# Patient Record
Sex: Female | Born: 1960 | Race: Black or African American | Hispanic: No | Marital: Married | Smoking: Current every day smoker
Health system: Southern US, Community
[De-identification: ages and names within clinical notes are randomized; demographics above are authoritative.]

## PROBLEM LIST (undated history)

## (undated) DIAGNOSIS — Z8619 Personal history of other infectious and parasitic diseases: Secondary | ICD-10-CM

## (undated) DIAGNOSIS — M8580 Other specified disorders of bone density and structure, unspecified site: Principal | ICD-10-CM

## (undated) HISTORY — PX: NO PAST SURGERIES: SHX2092

## (undated) HISTORY — DX: Other specified disorders of bone density and structure, unspecified site: M85.80

## (undated) HISTORY — DX: Personal history of other infectious and parasitic diseases: Z86.19

---

## 2011-03-30 ENCOUNTER — Encounter: Payer: Self-pay | Admitting: Internal Medicine

## 2011-03-30 ENCOUNTER — Ambulatory Visit (INDEPENDENT_AMBULATORY_CARE_PROVIDER_SITE_OTHER): Payer: BC Managed Care – PPO | Admitting: Internal Medicine

## 2011-03-30 ENCOUNTER — Telehealth: Payer: Self-pay | Admitting: Internal Medicine

## 2011-03-30 VITALS — BP 124/82 | HR 68 | Temp 98.3°F | Resp 18 | Ht 62.0 in | Wt 195.0 lb

## 2011-03-30 DIAGNOSIS — K5900 Constipation, unspecified: Secondary | ICD-10-CM | POA: Insufficient documentation

## 2011-03-30 DIAGNOSIS — Z803 Family history of malignant neoplasm of breast: Secondary | ICD-10-CM

## 2011-03-30 DIAGNOSIS — N926 Irregular menstruation, unspecified: Secondary | ICD-10-CM | POA: Insufficient documentation

## 2011-03-30 DIAGNOSIS — F17200 Nicotine dependence, unspecified, uncomplicated: Secondary | ICD-10-CM

## 2011-03-30 DIAGNOSIS — Z1239 Encounter for other screening for malignant neoplasm of breast: Secondary | ICD-10-CM

## 2011-03-30 MED ORDER — BUPROPION HCL ER (SR) 150 MG PO TB12: 150.0000 mg | ORAL_TABLET | Freq: Two times a day (BID) | ORAL | Status: DC

## 2011-03-30 NOTE — Telephone Encounter (Signed)
Spoke with pharmacist. He stated patient was in there system under another name, However they do have the Rx sent by provider. No action required.

## 2011-03-30 NOTE — Patient Instructions (Signed)
Please try benefiber for your constipation. It is available over the counter. Please schedule cbc, chem7, tsh (menstraul irregularity) and lipid (chol screening) for next Monday fasting Schedule your mammogram (the order has been placed) When you check out please make an appointment (30 min) for a pap smear

## 2011-03-30 NOTE — Progress Notes (Signed)
  Subjective:    Patient ID: Stephanie Barton, female    DOB: [birth year], 50 y.o.   MRN: [id number]  HPI Pt presents to clinic to establish care and for evaluation of menstrual irregularity. Notes intermittent irregularity of periods for ~6months also accompanied by hot flashes and night sweats. No h/o hysterectomy. Tried black cohash without improvement. Sweats and flashes improved with otc macca root. Notes h/o intermittent constipation with bm occuring every other day to every four days. No associated abdominal pain or blood in stool. Family h/o breast cancer in sister and has not undergone mammography before. Last pap 2010 without h/o abn pap. Smoking tobacco <1ppd (one pack lasts ~3d). Interested in cessation and possible medication to help. No other complaints.   Past Medical History  Diagnosis Date  . History of chicken pox     childhood   Past Surgical History  Procedure Date  . No past surgeries 12/26/212    reports that she has been smoking.  She has never used smokeless tobacco. She reports that she does not drink alcohol or use illicit drugs. family history includes Brain cancer in her mother and Breast cancer in her sister. No Known Allergies   Review of Systems  Constitutional: Positive for diaphoresis. Negative for fever.  Gastrointestinal: Positive for constipation. Negative for abdominal pain, diarrhea and blood in stool.  Genitourinary: Positive for menstrual problem.  All other systems reviewed and are negative.       Objective:   Physical Exam  Physical Exam  Nursing note and vitals reviewed. Constitutional: Appears well-developed and well-nourished. No distress.  HENT:  Head: Normocephalic and atraumatic.  Right Ear: External ear normal.  Left Ear: External ear normal.  Eyes: Conjunctivae are normal. No scleral icterus.  Neck: Neck supple. Carotid bruit is not present.  Cardiovascular: Normal rate, regular rhythm and normal heart sounds.  Exam reveals no  gallop and no friction rub.   No murmur heard. Pulmonary/Chest: Effort normal and breath sounds normal. No respiratory distress. He has no wheezes. no rales.  Lymphadenopathy:    He has no cervical adenopathy.  Neurological:Alert.  Skin: Skin is warm and dry. Not diaphoretic.  Psychiatric: Has a normal mood and affect.        Assessment & Plan:

## 2011-03-30 NOTE — Assessment & Plan Note (Signed)
Discussed tobacco cessation, potential complications of continued tobacco use and possible medications to assist cessation. Total time of discussion 4 minutes. Wishes to begin wellbutrin.

## 2011-03-30 NOTE — Assessment & Plan Note (Signed)
Suspect early perimenopause sx's. Discourage estrogen use. Ok to continue otc medications. With sweats and hot flashes obtain cbc, chem7 and tsh.

## 2011-03-30 NOTE — Assessment & Plan Note (Signed)
Obtain tsh. Attempt benefiber. Followup if no improvement or worsening.

## 2011-03-30 NOTE — Assessment & Plan Note (Signed)
Schedule mammogram.

## 2011-04-04 ENCOUNTER — Ambulatory Visit (HOSPITAL_BASED_OUTPATIENT_CLINIC_OR_DEPARTMENT_OTHER)
Admission: RE | Admit: 2011-04-04 | Discharge: 2011-04-04 | Disposition: A | Discharge status: Home / Self Care | Payer: BC Managed Care – PPO | Source: Ambulatory Visit | Attending: Internal Medicine | Admitting: Internal Medicine

## 2011-04-04 DIAGNOSIS — Z1239 Encounter for other screening for malignant neoplasm of breast: Secondary | ICD-10-CM

## 2011-04-04 DIAGNOSIS — Z1231 Encounter for screening mammogram for malignant neoplasm of breast: Secondary | ICD-10-CM

## 2011-04-11 ENCOUNTER — Encounter: Payer: Self-pay | Admitting: Family

## 2011-04-11 ENCOUNTER — Other Ambulatory Visit (HOSPITAL_COMMUNITY)
Admission: RE | Admit: 2011-04-11 | Discharge: 2011-04-11 | Disposition: A | Discharge status: Home / Self Care | Payer: BC Managed Care – PPO | Source: Ambulatory Visit | Attending: Family | Admitting: Family

## 2011-04-11 ENCOUNTER — Ambulatory Visit (INDEPENDENT_AMBULATORY_CARE_PROVIDER_SITE_OTHER): Payer: BC Managed Care – PPO | Admitting: Family

## 2011-04-11 DIAGNOSIS — Z01419 Encounter for gynecological examination (general) (routine) without abnormal findings: Secondary | ICD-10-CM

## 2011-04-11 DIAGNOSIS — F17200 Nicotine dependence, unspecified, uncomplicated: Secondary | ICD-10-CM

## 2011-04-11 DIAGNOSIS — Z803 Family history of malignant neoplasm of breast: Secondary | ICD-10-CM

## 2011-04-11 DIAGNOSIS — R030 Elevated blood-pressure reading, without diagnosis of hypertension: Secondary | ICD-10-CM

## 2011-04-11 DIAGNOSIS — Z23 Encounter for immunization: Secondary | ICD-10-CM

## 2011-04-11 NOTE — Progress Notes (Signed)
Subjective:    Patient ID: Stephanie Barton, female    DOB: 09-14-1960, 51 y.o.   MRN: [id number]  HPI  Tobacco abuse- reports that she feels drowsy on wellbutrin.  She reports She continues to smoke 3-4 cig a day.    GYN- last pap smear was 5 yrs ago.  She reports that she had an abnormal pap >5 yrs with normal follow up pap smear.  She reports that she has had spotting.  She continues hot flashes/night sweats.  She had her mammogram last Monday which was normal.    Review of Systems    see HPI  Past Medical History  Diagnosis Date  . History of chicken pox     childhood    History   Social History  . Marital Status: Married    Spouse Name: N/A    Number of Children: N/A  . Years of Education: N/A   Occupational History  . Not on file.   Social History Main Topics  . Smoking status: Current Everyday Smoker  . Smokeless tobacco: Never Used  . Alcohol Use: No  . Drug Use: No  . Sexually Active: Not on file   Other Topics Concern  . Not on file   Social History Narrative  . No narrative on file    Past Surgical History  Procedure Date  . No past surgeries 12/26/212    Family History  Problem Relation Age of Onset  . Breast cancer Sister     deceased 42  . Brain cancer Mother     brain tumor    No Known Allergies  Current Outpatient Prescriptions on File Prior to Visit  Medication Sig Dispense Refill  . 5-HTP CAPS Take by mouth daily.        Marland Kitchen aspirin EC 325 MG tablet Take 325 mg by mouth daily.        . Cyanocobalamin (B-12) 2500 MCG SUBL Place 2,500 mcg under the tongue daily.        . Maca Root (MACA PO) Take by mouth 3 (three) times daily.          BP 156/90  Pulse 72  Temp(Src) 97.9 F (36.6 C) (Oral)  Resp 16  Ht 5\' 2"  (1.575 m)  Wt 195 lb (88.451 kg)  BMI 35.67 kg/m2  LMP 03/28/2011    Objective:   Physical Exam  Constitutional: She appears well-developed and well-nourished. No distress.  Cardiovascular: Normal rate and regular  rhythm.   No murmur heard. Pulmonary/Chest: Effort normal and breath sounds normal. No respiratory distress. She has no wheezes. She has no rales. She exhibits no tenderness.  Genitourinary:       Breasts: Examined lying.  Right: Without masses, retractions, discharge or axillary adenopathy.  Left: Without masses, retractions, discharge or axillary adenopathy.  Inguinal/mons: Normal without inguinal adenopathy  External genitalia: Normal  BUS/Urethra/Skene's glands: Normal  Bladder: Normal  Vagina: Normal  Cervix: Normal- dark blood noted at cervical opening Uterus: normal in size, shape and contour. Midline and mobile  Adnexa/parametria:  Rt: Without masses or tenderness.  Lt: Without masses or tenderness.  Anus and perineum: Normal Mervin Kung CMA assisted present for examination            Assessment & Plan:   BP Readings from Last 3 Encounters:  04/11/11 156/90  03/30/11 124/82   25 minutes spent with pt today.  >50% of this time was spent counseling pt on smoking cessation, diet, exercise.  Tdap and flu shot  given today.

## 2011-04-11 NOTE — Assessment & Plan Note (Signed)
Mammogram normal 12/12.

## 2011-04-11 NOTE — Progress Notes (Signed)
Addended by: Mervin Kung A on: 04/11/2011 01:33 PM   Modules accepted: Orders

## 2011-04-11 NOTE — Patient Instructions (Signed)
We will mail you your pap smear results.   Please return to the lab fasting one day this week for your blood work.  Work on a low sodium diet, exercise, weight loss.  Follow up with Dr. Rodena Medin in 1 month for a BP recheck.

## 2011-04-11 NOTE — Assessment & Plan Note (Signed)
BP is elevated today- was ok last visit.  Recommended low sodium diet, exercise, weight loss.  Follow up with Dr. Rodena Medin in 1 month for BP check.

## 2011-04-11 NOTE — Assessment & Plan Note (Signed)
She wishes to stop wellbutrin.  Cost is an issue with chantix.  She wishes to try the nicorette gum instead.  We discussed the importance of smoking cessation.

## 2011-04-11 NOTE — Assessment & Plan Note (Signed)
Pap performed today- though may be obscured by menstrual bleeding. We discussed importance of SBE.  Pt is not fasting today, recommended that she return fasting one day this week to complete labs ordered by Dr. Rodena Medin.

## 2011-04-14 ENCOUNTER — Encounter: Payer: Self-pay | Admitting: Family

## 2011-09-05 ENCOUNTER — Encounter: Payer: Self-pay | Admitting: Internal Medicine

## 2011-09-05 ENCOUNTER — Ambulatory Visit (INDEPENDENT_AMBULATORY_CARE_PROVIDER_SITE_OTHER): Payer: BC Managed Care – PPO | Admitting: Internal Medicine

## 2011-09-05 ENCOUNTER — Ambulatory Visit (HOSPITAL_BASED_OUTPATIENT_CLINIC_OR_DEPARTMENT_OTHER)
Admission: RE | Admit: 2011-09-05 | Discharge: 2011-09-05 | Disposition: A | Discharge status: Home / Self Care | Payer: BC Managed Care – PPO | Source: Ambulatory Visit | Attending: Internal Medicine | Admitting: Internal Medicine

## 2011-09-05 VITALS — BP 116/78 | HR 71 | Temp 98.1°F | Resp 16 | Wt 196.0 lb

## 2011-09-05 DIAGNOSIS — M79609 Pain in unspecified limb: Secondary | ICD-10-CM

## 2011-09-05 DIAGNOSIS — M79673 Pain in unspecified foot: Secondary | ICD-10-CM

## 2011-09-05 MED ORDER — DICLOFENAC SODIUM 75 MG PO TBEC: DELAYED_RELEASE_TABLET | ORAL | Status: AC

## 2011-09-11 DIAGNOSIS — M79673 Pain in unspecified foot: Secondary | ICD-10-CM | POA: Insufficient documentation

## 2011-09-11 NOTE — Assessment & Plan Note (Signed)
Attempt voltaren with food and no other nsaid. Obtain plain xray of foot. Work note provided. Followup if no improvement or worsening.

## 2011-09-11 NOTE — Progress Notes (Signed)
  Subjective:    Patient ID: Stephanie Barton, female    DOB: 1960/10/10, 51 y.o.   MRN: [id number]  HPI Pt presents to clinic for evaluation of foot pain. Notes right foot pain along plantar medial aspect. Describes as sharp. No injury. Pain worse on wt bearing. Taking no medication for the problem. No other alleviating or exacerbating factors.   Past Medical History  Diagnosis Date  . History of chicken pox     childhood   Past Surgical History  Procedure Date  . No past surgeries 12/26/212    reports that she has been smoking.  She has never used smokeless tobacco. She reports that she does not drink alcohol or use illicit drugs. family history includes Brain cancer in her mother and Breast cancer in her sister. No Known Allergies   Review of Systems see hpi     Objective:   Physical Exam  Nursing note and vitals reviewed. Constitutional: She appears well-developed and well-nourished. No distress.  HENT:  Head: Normocephalic and atraumatic.  Musculoskeletal:       Right foot: no erythema, warmth or effusion. +mild tender medial plantar. No mass or bony abn. Gait nl.  Neurological: She is alert.  Skin: Skin is warm and dry. She is not diaphoretic.  Psychiatric: She has a normal mood and affect.          Assessment & Plan:

## 2014-08-20 ENCOUNTER — Telehealth: Payer: Self-pay | Admitting: Internal Medicine

## 2014-08-20 NOTE — Telephone Encounter (Signed)
Pre Visit letter sent  °

## 2014-09-11 ENCOUNTER — Telehealth: Payer: Self-pay | Admitting: *Deleted

## 2014-09-11 ENCOUNTER — Encounter: Payer: Self-pay | Admitting: *Deleted

## 2014-09-11 NOTE — Telephone Encounter (Signed)
Unable to reach patient at time of Pre-Visit Call.  Left message for patient to return call when available.    

## 2014-09-11 NOTE — Telephone Encounter (Signed)
Pre-Visit Call completed with patient and chart updated.   Pre-Visit Info documented in Specialty Comments under [employer].    

## 2014-09-11 NOTE — Addendum Note (Signed)
Addended by: Leticia Penna A on: 09/11/2014 01:45 PM   Modules accepted: Medications

## 2014-09-11 NOTE — Addendum Note (Signed)
Addended by: Leticia Penna A on: 09/11/2014 01:46 PM   Modules accepted: Medications

## 2014-09-12 ENCOUNTER — Other Ambulatory Visit (HOSPITAL_COMMUNITY)
Admission: RE | Admit: 2014-09-12 | Discharge: 2014-09-12 | Disposition: A | Discharge status: Home / Self Care | Payer: BLUE CROSS/BLUE SHIELD | Source: Ambulatory Visit | Attending: Family | Admitting: Family

## 2014-09-12 ENCOUNTER — Ambulatory Visit (INDEPENDENT_AMBULATORY_CARE_PROVIDER_SITE_OTHER): Payer: BLUE CROSS/BLUE SHIELD | Admitting: Family

## 2014-09-12 ENCOUNTER — Encounter: Payer: Self-pay | Admitting: Family

## 2014-09-12 VITALS — BP 136/82 | HR 81 | Temp 98.2°F | Resp 16 | Ht 62.0 in | Wt 179.8 lb

## 2014-09-12 DIAGNOSIS — N852 Hypertrophy of uterus: Secondary | ICD-10-CM | POA: Diagnosis not present

## 2014-09-12 DIAGNOSIS — Z720 Tobacco use: Secondary | ICD-10-CM

## 2014-09-12 DIAGNOSIS — N951 Menopausal and female climacteric states: Secondary | ICD-10-CM

## 2014-09-12 DIAGNOSIS — Z01419 Encounter for gynecological examination (general) (routine) without abnormal findings: Secondary | ICD-10-CM | POA: Diagnosis not present

## 2014-09-12 DIAGNOSIS — Z0000 Encounter for general adult medical examination without abnormal findings: Secondary | ICD-10-CM

## 2014-09-12 DIAGNOSIS — F17200 Nicotine dependence, unspecified, uncomplicated: Secondary | ICD-10-CM

## 2014-09-12 DIAGNOSIS — R232 Flushing: Secondary | ICD-10-CM

## 2014-09-12 DIAGNOSIS — Z1151 Encounter for screening for human papillomavirus (HPV): Secondary | ICD-10-CM | POA: Diagnosis present

## 2014-09-12 DIAGNOSIS — E2839 Other primary ovarian failure: Secondary | ICD-10-CM | POA: Diagnosis not present

## 2014-09-12 LAB — URINALYSIS, ROUTINE W REFLEX MICROSCOPIC
Bilirubin Urine: NEGATIVE
Hgb urine dipstick: NEGATIVE
LEUKOCYTES UA: NEGATIVE
Nitrite: NEGATIVE
Specific Gravity, Urine: >=1.03 — AB (ref 1.000–1.030)
Total Protein, Urine: NEGATIVE
UROBILINOGEN UA: 0.2 (ref 0.0–1.0)
Urine Glucose: NEGATIVE
pH: 6 (ref 5.0–8.0)

## 2014-09-12 LAB — CBC WITH DIFFERENTIAL/PLATELET
BASOS ABS: 0 10*3/uL (ref 0.0–0.1)
Basophils Relative: 0.6 % (ref 0.0–3.0)
Eosinophils Absolute: 0.3 10*3/uL (ref 0.0–0.7)
Eosinophils Relative: 3.7 % (ref 0.0–5.0)
HCT: 40.9 % (ref 36.0–46.0)
Hemoglobin: 13.2 g/dL (ref 12.0–15.0)
LYMPHS ABS: 2.8 10*3/uL (ref 0.7–4.0)
Lymphocytes Relative: 37.5 % (ref 12.0–46.0)
MCHC: 32.4 g/dL (ref 30.0–36.0)
MCV: 85.5 fl (ref 78.0–100.0)
Monocytes Absolute: 0.4 10*3/uL (ref 0.1–1.0)
Monocytes Relative: 5 % (ref 3.0–12.0)
NEUTROS ABS: 3.9 10*3/uL (ref 1.4–7.7)
Neutrophils Relative %: 53.2 % (ref 43.0–77.0)
Platelets: 167 10*3/uL (ref 150.0–400.0)
RBC: 4.78 Mil/uL (ref 3.87–5.11)
RDW: 13.8 % (ref 11.5–15.5)
WBC: 7.4 10*3/uL (ref 4.0–10.5)

## 2014-09-12 LAB — BASIC METABOLIC PANEL
BUN: 16 mg/dL (ref 6–23)
CHLORIDE: 106 meq/L (ref 96–112)
CO2: 28 mEq/L (ref 19–32)
Calcium: 9.8 mg/dL (ref 8.4–10.5)
Creatinine, Ser: 0.68 mg/dL (ref 0.40–1.20)
GFR: 116.1 mL/min (ref >60.00)
Glucose, Bld: 88 mg/dL (ref 70–99)
Potassium: 4.1 mEq/L (ref 3.5–5.1)
SODIUM: 138 meq/L (ref 135–145)

## 2014-09-12 LAB — HEPATIC FUNCTION PANEL
ALK PHOS: 89 U/L (ref 39–117)
ALT: 11 U/L (ref 0–35)
AST: 14 U/L (ref 0–37)
Albumin: 4.1 g/dL (ref 3.5–5.2)
BILIRUBIN TOTAL: 0.3 mg/dL (ref 0.2–1.2)
Bilirubin, Direct: 0 mg/dL (ref 0.0–0.3)
Total Protein: 6.8 g/dL (ref 6.0–8.3)

## 2014-09-12 LAB — LIPID PANEL
CHOL/HDL RATIO: 3
Cholesterol: 160 mg/dL (ref 0–200)
HDL: 57.7 mg/dL (ref >39.00)
LDL CALC: 82 mg/dL (ref 0–99)
NonHDL: 102.3
TRIGLYCERIDES: 104 mg/dL (ref 0.0–149.0)
VLDL: 20.8 mg/dL (ref 0.0–40.0)

## 2014-09-12 LAB — TSH: TSH: 0.48 u[IU]/mL (ref 0.35–4.50)

## 2014-09-12 MED ORDER — VARENICLINE TARTRATE 0.5 MG X 11 & 1 MG X 42 PO MISC: ORAL | Status: DC

## 2014-09-12 MED ORDER — CITALOPRAM HYDROBROMIDE 10 MG PO TABS: 10.0000 mg | ORAL_TABLET | Freq: Every day | ORAL | Status: DC

## 2014-09-12 NOTE — Assessment & Plan Note (Signed)
Will refer for pelvic ultrasound to further evaluate.

## 2014-09-12 NOTE — Progress Notes (Signed)
Pre visit review using our clinic review tool, if applicable. No additional management support is needed unless otherwise documented below in the visit note. 

## 2014-09-12 NOTE — Addendum Note (Signed)
Addended by: Kelle Darting A on: 09/12/2014 03:24 PM   Modules accepted: Orders

## 2014-09-12 NOTE — Patient Instructions (Addendum)
Start citalopram for hot flashes. Complete lab work prior to leaving. You will be contacted about your referral to GI for colonoscopy  Please let us know if you have not heard back within 1 week about your referral. Schedule a routine eye exam. Continue to work on healthy diet, exercise, weight loss. Start chantix to help you quit smoking. Call before the first month runs out to let us know how you are doing and we can send your refills for months 2 and 3.  Follow up in 6 weeks.

## 2014-09-12 NOTE — Assessment & Plan Note (Signed)
Not well controlled. Will give trial of citalopram.

## 2014-09-12 NOTE — Assessment & Plan Note (Signed)
Trial of chantix. Common side effects including rare risk of suicide ideation was discussed with the patient today.  Patient is instructed to go directly to the ED if this occurs.  We discussed that patient can continue to smoke for 1 week after starting chantix, but then must discontinue cigarettes.  He is also instructed to contact us prior to completion of the starter month pack for an rx for the continuation month pack.  5 minutes spent with patient today on tobacco cessation counseling.   

## 2014-09-12 NOTE — Assessment & Plan Note (Signed)
Discussed healthy diet, exercise and weight loss. Refer for mammo, colo, dexa. Obtain routine lab work.

## 2014-09-12 NOTE — Progress Notes (Addendum)
Subjective:    Patient ID: Stephanie Barton, female    DOB: 1960/10/09, 54 y.o.   MRN: [id number]  HPI  Stephanie Barton is a 54 yr old female who presents today for cpx.  Patient presents today for complete physical.  Immunizations: up to date Diet: reports diet low in fruits/veggies Exercise: some exercise Colonoscopy: due Dexa: due Pap Smear: pap smear, 2013 Mammogram: due Dental:  Up to date Vision- due Tobacco abuse- 1/3 PPD.   Wt Readings from Last 3 Encounters:  09/12/14 179 lb 12.8 oz (81.557 kg)  09/05/11 196 lb (88.905 kg)  04/11/11 195 lb (88.451 kg)      Review of Systems  Constitutional: Negative for unexpected weight change.  HENT: Negative for hearing loss and rhinorrhea.   Eyes:       Occasional blurring, has not had eyes examined  Respiratory: Negative for cough.   Cardiovascular: Negative for chest pain and leg swelling.  Gastrointestinal: Negative for nausea and diarrhea.       Occasional constipation  Genitourinary: Negative for dysuria and frequency.  Musculoskeletal: Negative for myalgias and arthralgias.  Skin: Negative for rash.  Neurological: Negative for headaches.  Hematological: Negative for adenopathy.  Psychiatric/Behavioral: Negative for dysphoric mood and agitation.   Past Medical History  Diagnosis Date  . History of chicken pox     childhood    History   Social History  . Marital Status: Married    Spouse Name: N/A  . Number of Children: N/A  . Years of Education: N/A   Occupational History  . Not on file.   Social History Main Topics  . Smoking status: Current Every Day Smoker  . Smokeless tobacco: Never Used  . Alcohol Use: No  . Drug Use: No  . Sexual Activity: Not on file   Other Topics Concern  . Not on file   Social History Narrative    Past Surgical History  Procedure Laterality Date  . No past surgeries  12/26/212    Family History  Problem Relation Age of Onset  . Breast cancer Sister     deceased  36  . Brain cancer Mother     brain tumor    No Known Allergies  Current Outpatient Prescriptions on File Prior to Visit  Medication Sig Dispense Refill  . aspirin EC 325 MG tablet Take 650 mg by mouth daily.     Marland Kitchen BLACK COHOSH PO Take 2 tablets by mouth daily.     Marland Kitchen CINNAMON PO Take 1 tablet by mouth daily.     No current facility-administered medications on file prior to visit.    BP 136/82 mmHg  Pulse 81  Temp(Src) 98.2 F (36.8 C) (Oral)  Resp 16  Ht 5\' 2"  (1.575 m)  Wt 179 lb 12.8 oz (81.557 kg)  BMI 32.88 kg/m2  SpO2 98%  LMP 03/28/2011       Objective:   Physical Exam   Physical Exam  Constitutional: She is oriented to person, place, and time. She appears well-developed and well-nourished. No distress.  HENT:  Head: Normocephalic and atraumatic.  Right Ear: Tympanic membrane and ear canal normal.  Left Ear: Tympanic membrane and ear canal normal.  Mouth/Throat: Oropharynx is clear and moist.  Eyes: Pupils are equal, round, and reactive to light. No scleral icterus.  Neck: Normal range of motion. No thyromegaly present.  Cardiovascular: Normal rate and regular rhythm.   No murmur heard. Pulmonary/Chest: Effort normal and breath sounds normal. No respiratory distress.  He has no wheezes. She has no rales. She exhibits no tenderness.  Abdominal: Soft. Bowel sounds are normal. He exhibits no distension and no mass. There is no tenderness. There is no rebound and no guarding.  Musculoskeletal: She exhibits no edema.  Lymphadenopathy:    She has no cervical adenopathy.  Neurological: She is alert and oriented to person, place, and time. She has 1+ bilateral patellar reflexes. She exhibits normal muscle tone. Coordination normal.  Skin: Skin is warm and dry.  Psychiatric: She has a normal mood and affect. Her behavior is normal. Judgment and thought content normal.  Breasts: Examined lying Right: Without masses, retractions, discharge or axillary adenopathy.    Left: Without masses, retractions, discharge or axillary adenopathy.  Inguinal/mons: Normal without inguinal adenopathy  External genitalia: Normal  BUS/Urethra/Skene's glands: Normal  Bladder: Normal  Vagina: Normal  Cervix: Normal  Uterus: some uterine fullness is noted Midline and mobile  Adnexa/parametria:  Rt: Without masses or tenderness.  Lt: Without masses or tenderness.  Anus and perineum: Normal           Assessment & Plan:        Assessment & Plan:  I have personally reviewed and interpreted EKG- NSR without ischemic changes noted.

## 2014-09-12 NOTE — Addendum Note (Signed)
Addended by: Debbrah Alar on: 09/12/2014 01:16 PM   Modules accepted: Miquel Dunn

## 2014-09-16 LAB — CYTOLOGY - PAP

## 2014-09-17 ENCOUNTER — Encounter: Payer: Self-pay | Admitting: General Practice

## 2014-09-19 ENCOUNTER — Ambulatory Visit (HOSPITAL_BASED_OUTPATIENT_CLINIC_OR_DEPARTMENT_OTHER)
Admission: RE | Admit: 2014-09-19 | Discharge: 2014-09-19 | Disposition: A | Discharge status: Home / Self Care | Payer: BLUE CROSS/BLUE SHIELD | Source: Ambulatory Visit | Attending: Family | Admitting: Family

## 2014-09-19 DIAGNOSIS — N852 Hypertrophy of uterus: Secondary | ICD-10-CM

## 2014-09-19 DIAGNOSIS — M8580 Other specified disorders of bone density and structure, unspecified site: Secondary | ICD-10-CM | POA: Diagnosis not present

## 2014-09-19 DIAGNOSIS — E2839 Other primary ovarian failure: Secondary | ICD-10-CM | POA: Diagnosis present

## 2014-09-19 DIAGNOSIS — Z0000 Encounter for general adult medical examination without abnormal findings: Secondary | ICD-10-CM

## 2014-09-25 ENCOUNTER — Telehealth: Payer: Self-pay | Admitting: Family

## 2014-09-25 ENCOUNTER — Encounter: Payer: Self-pay | Admitting: Family

## 2014-09-25 DIAGNOSIS — M8580 Other specified disorders of bone density and structure, unspecified site: Secondary | ICD-10-CM | POA: Insufficient documentation

## 2014-09-25 HISTORY — DX: Other specified disorders of bone density and structure, unspecified site: M85.80

## 2014-09-25 NOTE — Telephone Encounter (Signed)
See letter 09/25/14.

## 2014-09-26 ENCOUNTER — Ambulatory Visit (HOSPITAL_BASED_OUTPATIENT_CLINIC_OR_DEPARTMENT_OTHER)
Admission: RE | Admit: 2014-09-26 | Discharge: 2014-09-26 | Disposition: A | Discharge status: Home / Self Care | Payer: BLUE CROSS/BLUE SHIELD | Source: Ambulatory Visit | Attending: Family | Admitting: Family

## 2014-09-26 DIAGNOSIS — D259 Leiomyoma of uterus, unspecified: Secondary | ICD-10-CM | POA: Insufficient documentation

## 2014-09-26 DIAGNOSIS — N852 Hypertrophy of uterus: Secondary | ICD-10-CM | POA: Diagnosis present

## 2014-09-28 ENCOUNTER — Telehealth: Payer: Self-pay | Admitting: Family

## 2014-09-28 DIAGNOSIS — R9389 Abnormal findings on diagnostic imaging of other specified body structures: Secondary | ICD-10-CM

## 2014-09-28 NOTE — Telephone Encounter (Signed)
Please advise pt that US shows fibroid as expected.  Note is made of irregularity left pelvis, likely fibroid.  I would like her to see GYN for further evaluation please.

## 2014-09-29 ENCOUNTER — Encounter: Payer: Self-pay | Admitting: Family

## 2014-09-29 NOTE — Telephone Encounter (Signed)
Advised patient per recommendations.  Referral entered

## 2014-10-24 ENCOUNTER — Ambulatory Visit: Payer: BLUE CROSS/BLUE SHIELD | Admitting: Family

## 2014-11-03 ENCOUNTER — Ambulatory Visit (INDEPENDENT_AMBULATORY_CARE_PROVIDER_SITE_OTHER): Payer: BLUE CROSS/BLUE SHIELD | Admitting: Family

## 2014-11-03 ENCOUNTER — Encounter: Payer: Self-pay | Admitting: Family

## 2014-11-03 VITALS — BP 126/94 | HR 83 | Temp 98.1°F | Resp 16 | Ht 62.0 in | Wt 185.6 lb

## 2014-11-03 DIAGNOSIS — N951 Menopausal and female climacteric states: Secondary | ICD-10-CM | POA: Diagnosis not present

## 2014-11-03 DIAGNOSIS — F17200 Nicotine dependence, unspecified, uncomplicated: Secondary | ICD-10-CM

## 2014-11-03 DIAGNOSIS — Z720 Tobacco use: Secondary | ICD-10-CM

## 2014-11-03 DIAGNOSIS — R232 Flushing: Secondary | ICD-10-CM

## 2014-11-03 MED ORDER — BUPROPION HCL ER (SR) 150 MG PO TB12: ORAL_TABLET | ORAL | Status: AC

## 2014-11-03 NOTE — Patient Instructions (Signed)
Start Wellbutrin once daily for 3 days then increase to twice daily on day four. Follow up in 6 weeks.

## 2014-11-03 NOTE — Progress Notes (Signed)
   Subjective:    Patient ID: Stephanie Barton, female    DOB: October 19, 1960, 54 y.o.   MRN: [id number]  HPI  [name] is a 54 yr old female who presents today for follow up.  1) Hot flashes- last visit pt was given rx for citalopram. Reports hot flashes are improved. Took citalopram x 1 week the stopped because she "felt funny."   2) Tobacco abuse- could not afford chantix. Wants to quit.  Currently smoking 4 cigarettes a day.    Review of Systems See HPI  Past Medical History  Diagnosis Date  . History of chicken pox     childhood  . Osteopenia 09/25/2014    History   Social History  . Marital Status: Married    Spouse Name: N/A  . Number of Children: N/A  . Years of Education: N/A   Occupational History  . Not on file.   Social History Main Topics  . Smoking status: Current Every Day Smoker  . Smokeless tobacco: Never Used  . Alcohol Use: No  . Drug Use: No  . Sexual Activity: Not on file   Other Topics Concern  . Not on file   Social History Narrative   Works at [employer]   Married   No children   Enjoys watching television   completed 10th grade   Raised in [place], moved to [place] when she was 61 when her mother passed       Past Surgical History  Procedure Laterality Date  . No past surgeries  12/26/212    Family History  Problem Relation Age of Onset  . Breast cancer Sister     deceased 69  . Brain cancer Mother     brain tumor    No Known Allergies  Current Outpatient Prescriptions on File Prior to Visit  Medication Sig Dispense Refill  . aspirin EC 325 MG tablet Take 650 mg by mouth daily.     Marland Kitchen BLACK COHOSH PO Take 2 tablets by mouth daily.     Marland Kitchen CINNAMON PO Take 1 tablet by mouth daily.    . Omega-3 Fatty Acids (FISH OIL) 1000 MG CAPS Take 1 capsule by mouth daily.     No current facility-administered medications on file prior to visit.    BP 126/94 mmHg  Pulse 83  Temp(Src) 98.1 F (36.7 C) (Oral)  Resp 16  Ht 5\' 2"  (1.575  m)  Wt 185 lb 9.6 oz (84.188 kg)  BMI 33.94 kg/m2  SpO2 100%  LMP 03/28/2011       Objective:   Physical Exam  Constitutional: She is oriented to person, place, and time. She appears well-developed and well-nourished.  HENT:  Head: Normocephalic and atraumatic.  Cardiovascular: Normal rate, regular rhythm and normal heart sounds.   No murmur heard. Pulmonary/Chest: Effort normal and breath sounds normal. No respiratory distress. She has no wheezes.  Neurological: She is alert and oriented to person, place, and time.  Psychiatric: She has a normal mood and affect. Her behavior is normal. Judgment and thought content normal.          Assessment & Plan:

## 2014-11-03 NOTE — Assessment & Plan Note (Signed)
Stable off of meds.  Monitor.  

## 2014-11-03 NOTE — Assessment & Plan Note (Signed)
Could not afford chantix. Start wellbutrin.

## 2014-11-03 NOTE — Progress Notes (Signed)
Pre visit review using our clinic review tool, if applicable. No additional management support is needed unless otherwise documented below in the visit note. 

## 2014-11-27 ENCOUNTER — Encounter: Payer: BLUE CROSS/BLUE SHIELD | Admitting: Gastroenterology

## 2016-02-16 IMAGING — MG MM DIGITAL SCREENING BILAT W/ CAD
5 series · 5 of 5 positions shown · non-contrast
Comparison: Previous exam(s).

CLINICAL DATA: Screening.

EXAM:
DIGITAL SCREENING BILATERAL MAMMOGRAM WITH CAD

[R CC]
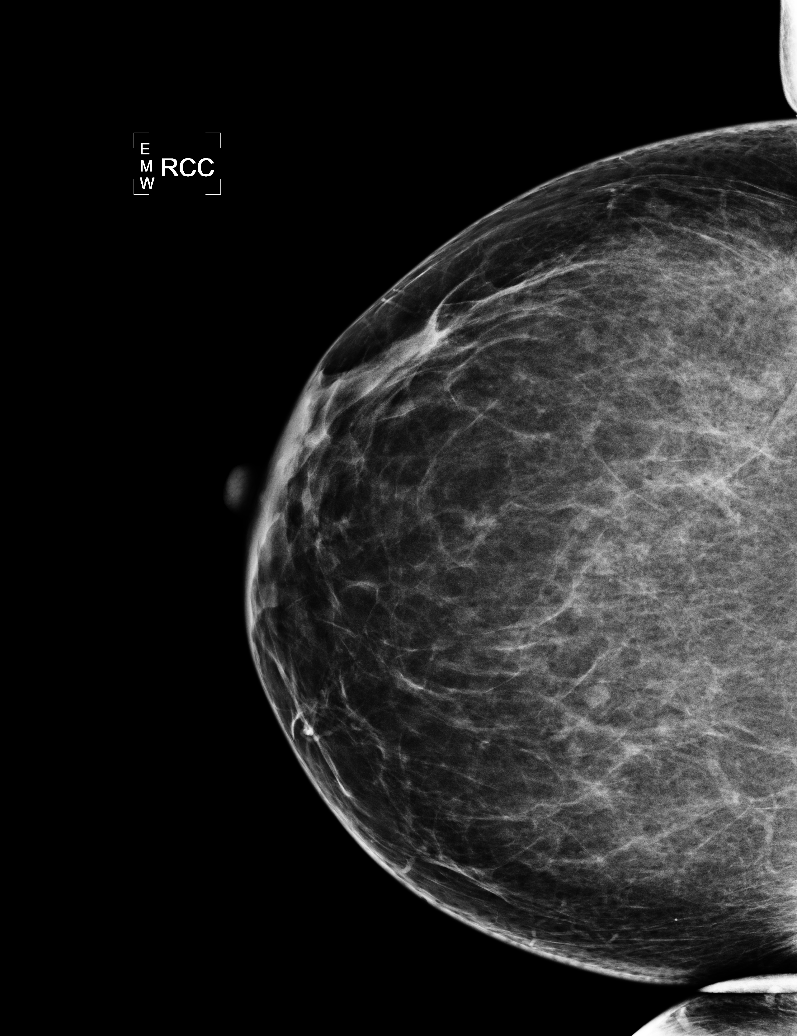

[L CC]
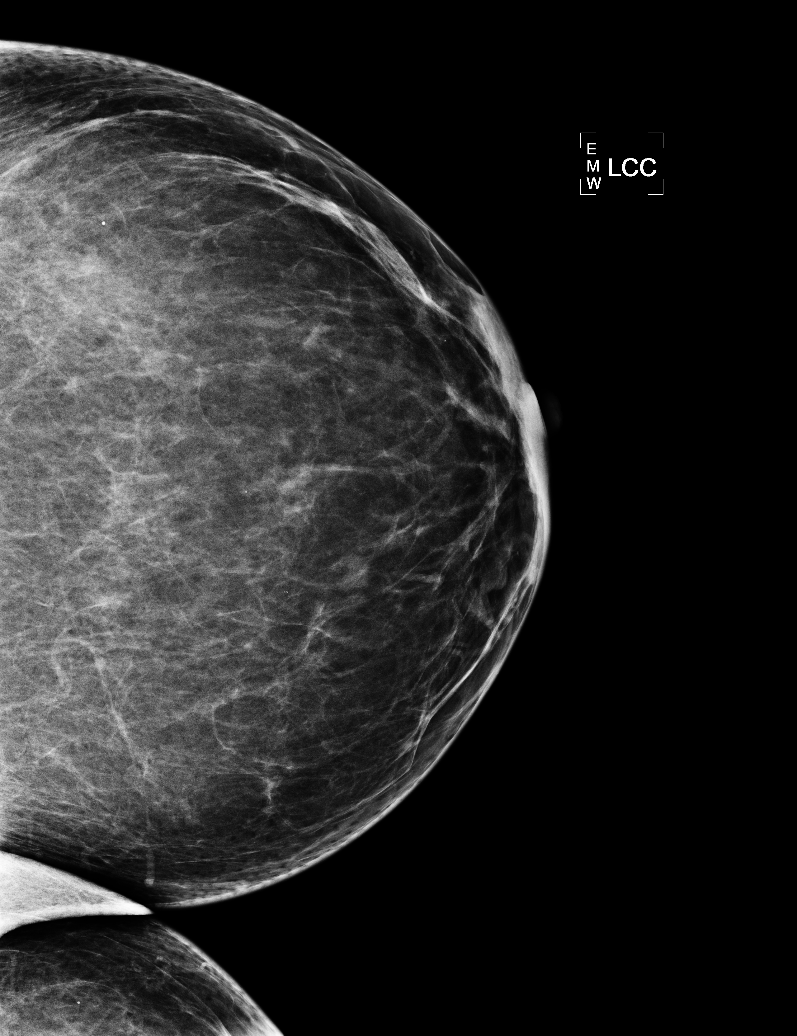

[L MLO]
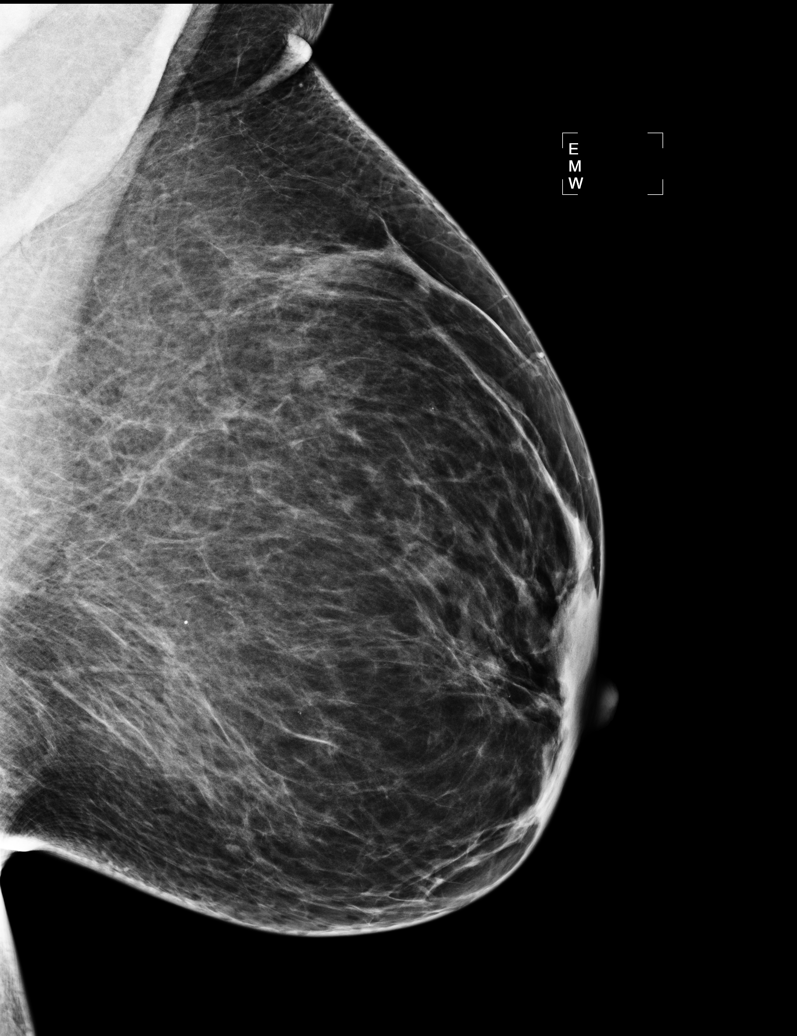

[R MLO]
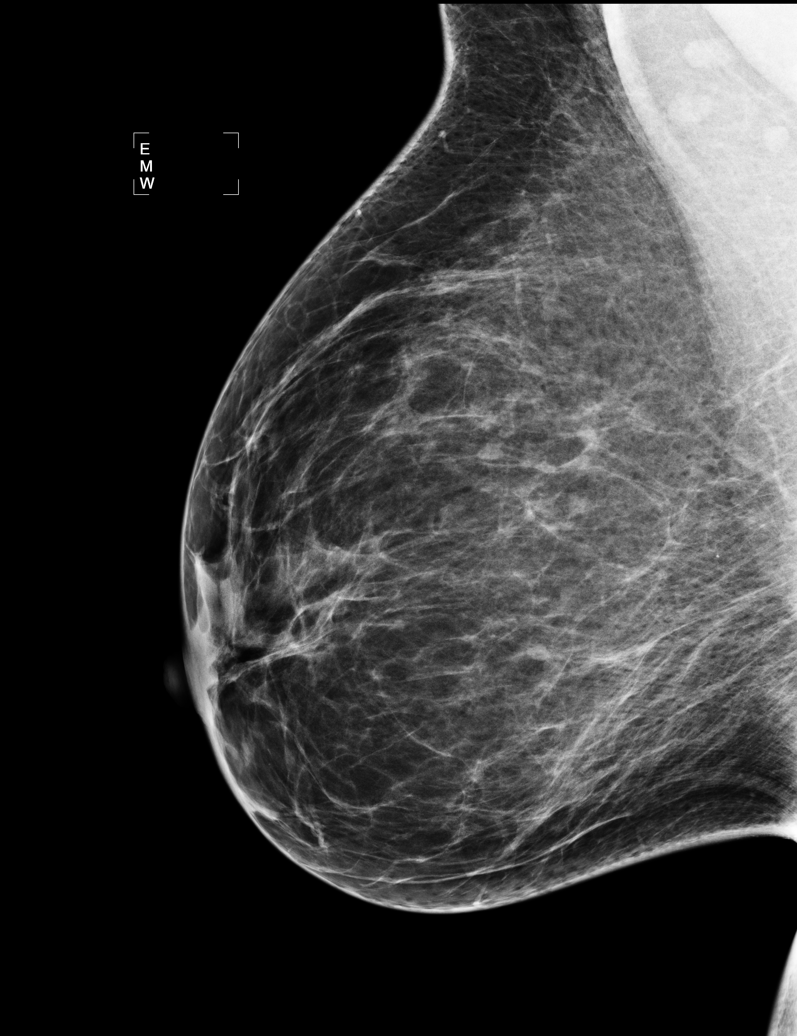

[L CV]
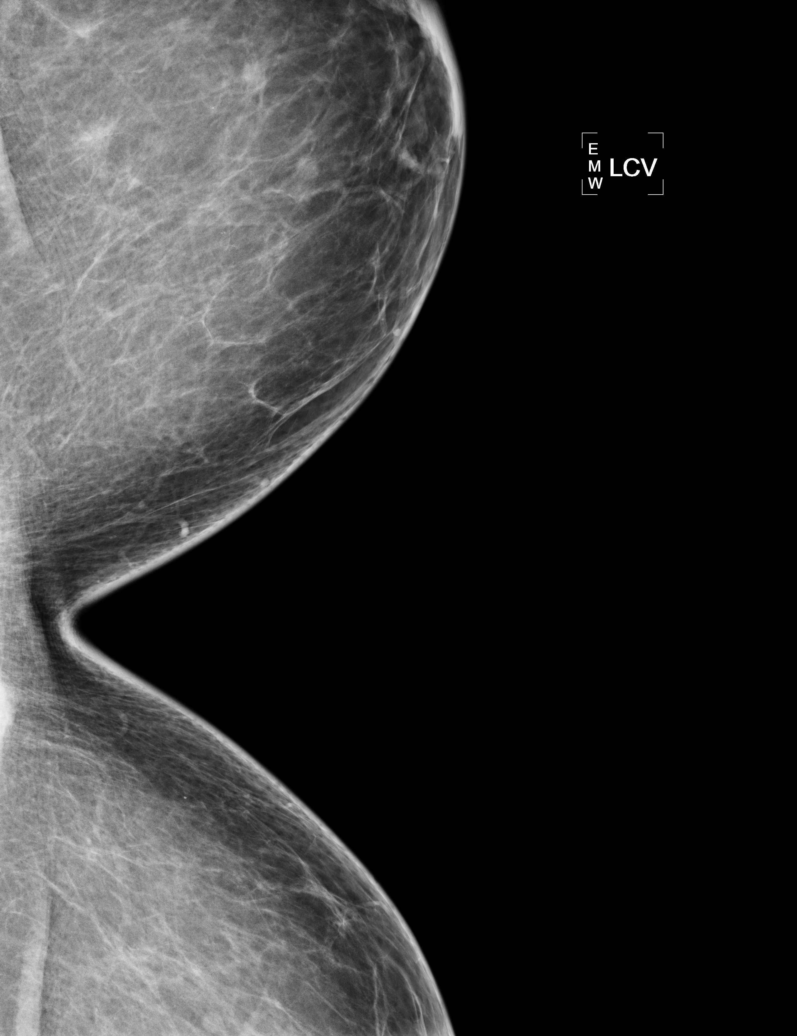

[5 of 5 positions shown; findings below may reference images not displayed]

ACR Breast Density Category b: There are scattered areas of
fibroglandular density.
FINDINGS: There are no findings suspicious for malignancy. Images were
processed with CAD.
IMPRESSION: No mammographic evidence of malignancy. A result letter of this
screening mammogram will be mailed directly to the patient.

RECOMMENDATION:
Screening mammogram in one year. (Code:AS-G-LCT)

BI-RADS CATEGORY  1: Negative.

## 2017-03-13 IMAGING — US US TRANSVAGINAL NON-OB
1 series · 13 of 25 positions shown · non-contrast
Comparison: None

CLINICAL DATA: Enlarged uterus on physical examination.

EXAM:
TRANSABDOMINAL AND TRANSVAGINAL ULTRASOUND OF PELVIS
TECHNIQUE: Both transabdominal and transvaginal ultrasound examinations of the
pelvis were performed. Transabdominal technique was performed for
global imaging of the pelvis including uterus, ovaries, adnexal
regions, and pelvic cul-de-sac. It was necessary to proceed with
endovaginal exam following the transabdominal exam to visualize the
uterus and endometrium..

[Series 1: us transvaginal non-ob · 0.24mm/px · 13 of 59 slices shown]
[im 1/59]
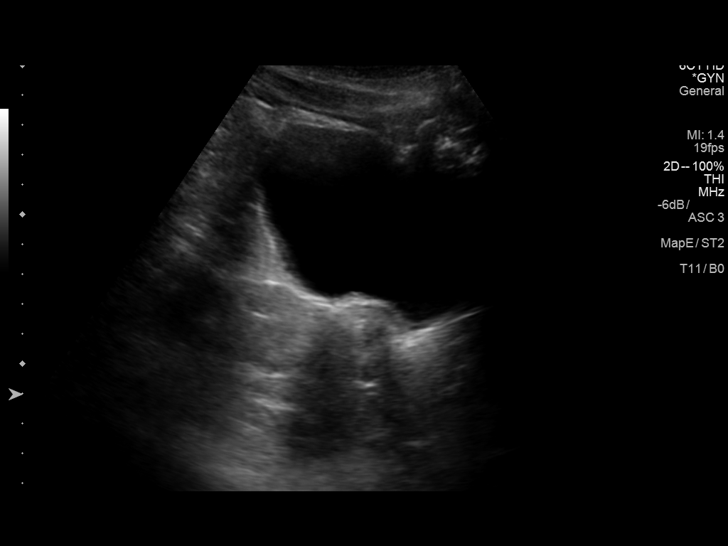
[im 5/59]
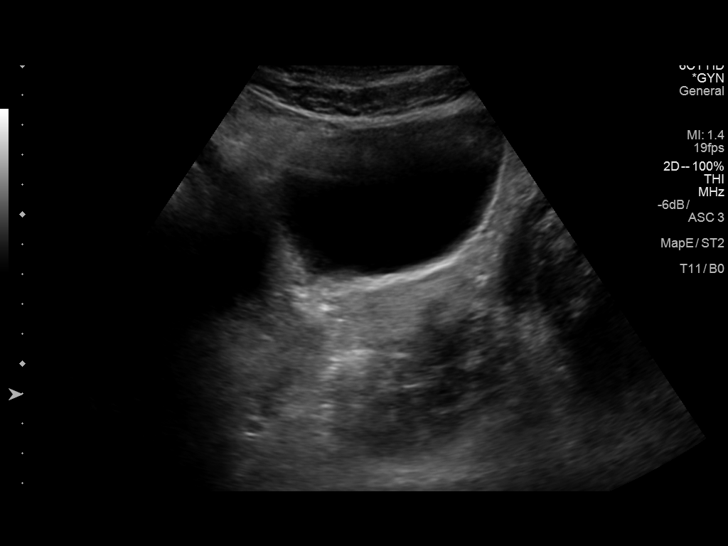
[im 10/59]
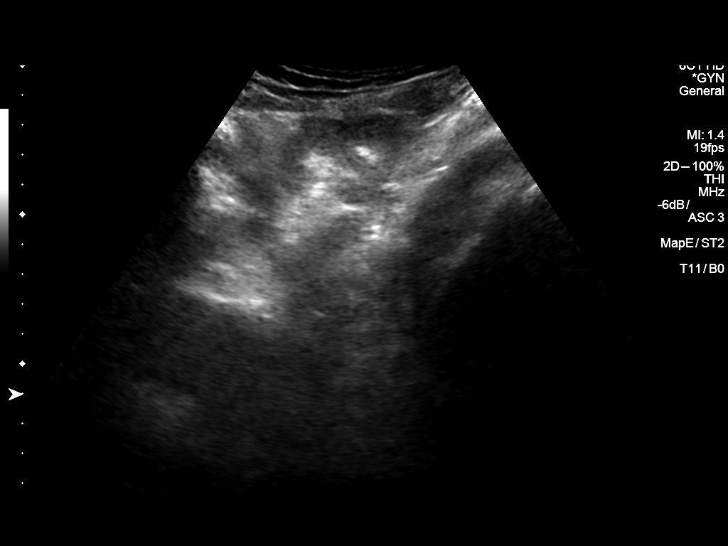
[im 15/59]
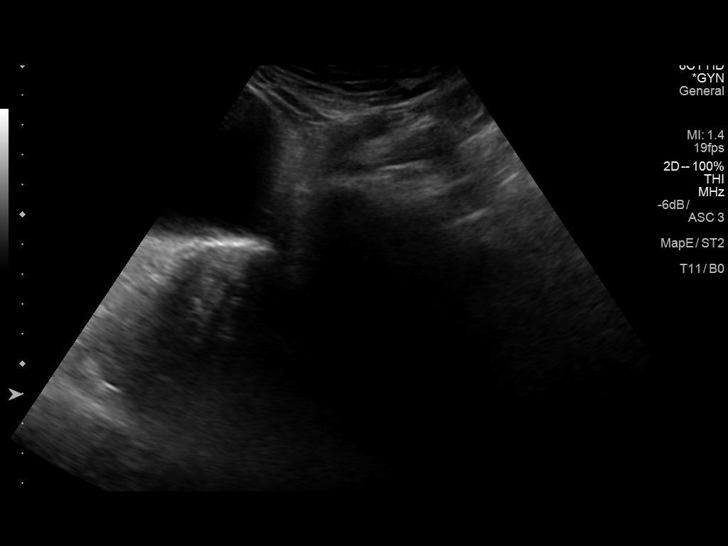
[im 20/59]
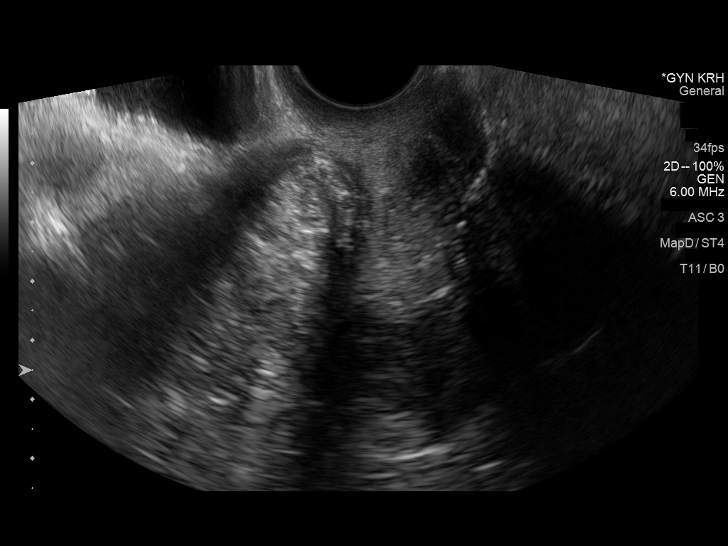
[im 25/59]
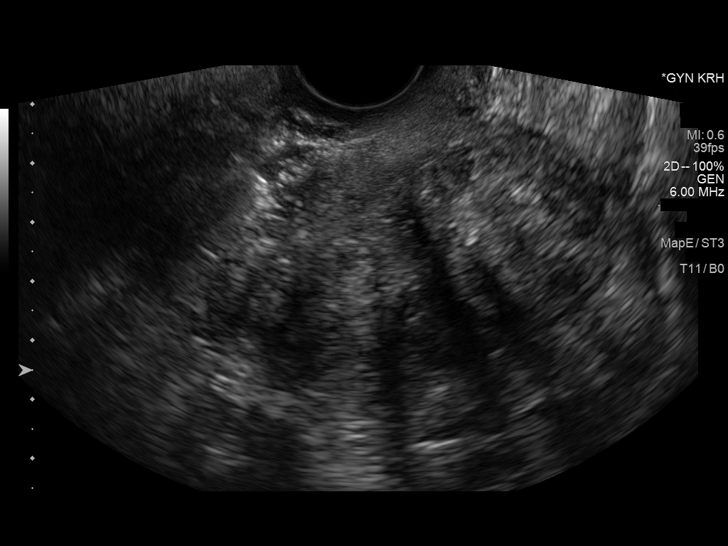
[im 30/59]
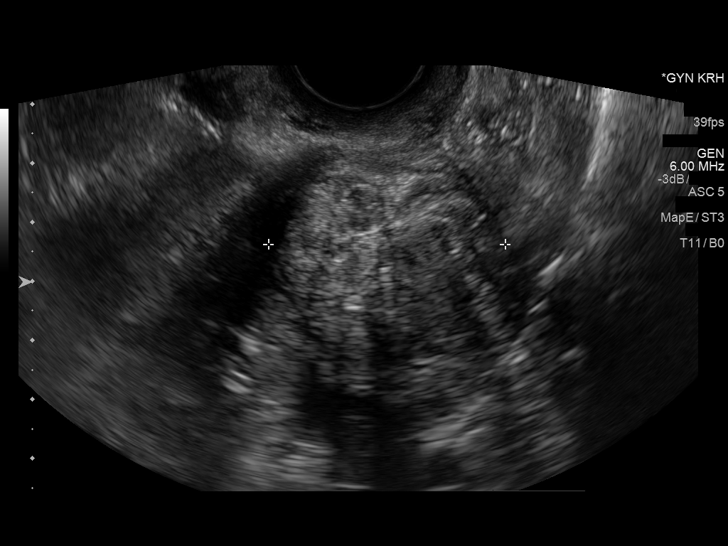
[im 34/59]
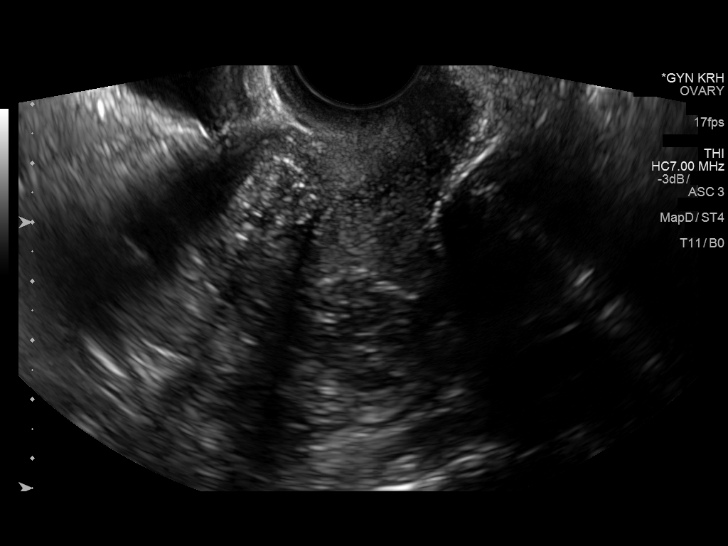
[im 39/59]
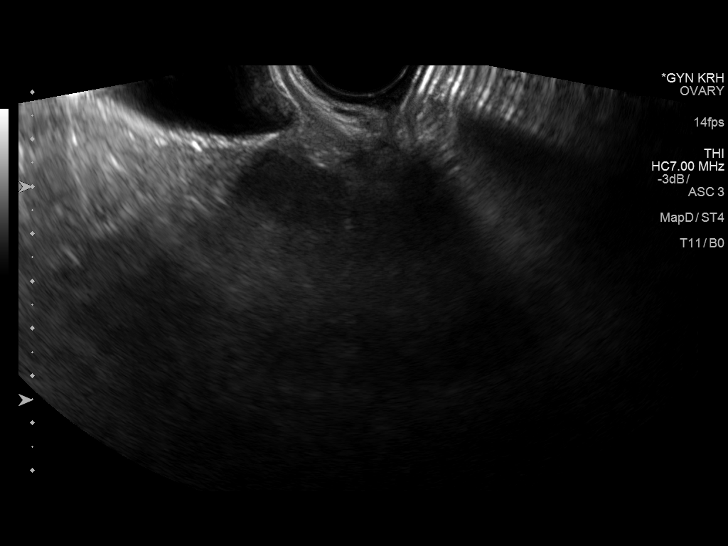
[im 44/59]
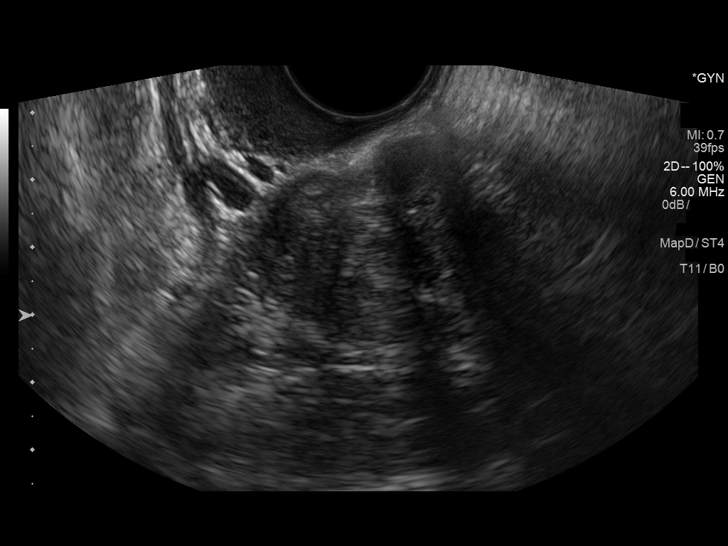
[im 49/59]
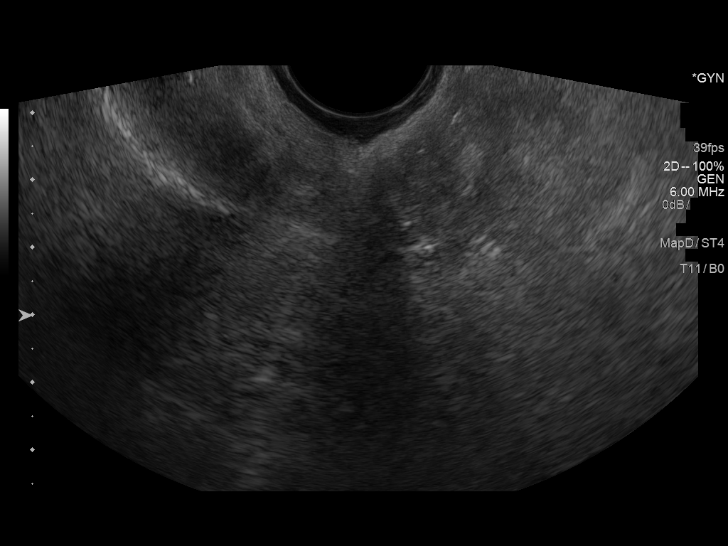
[im 54/59]
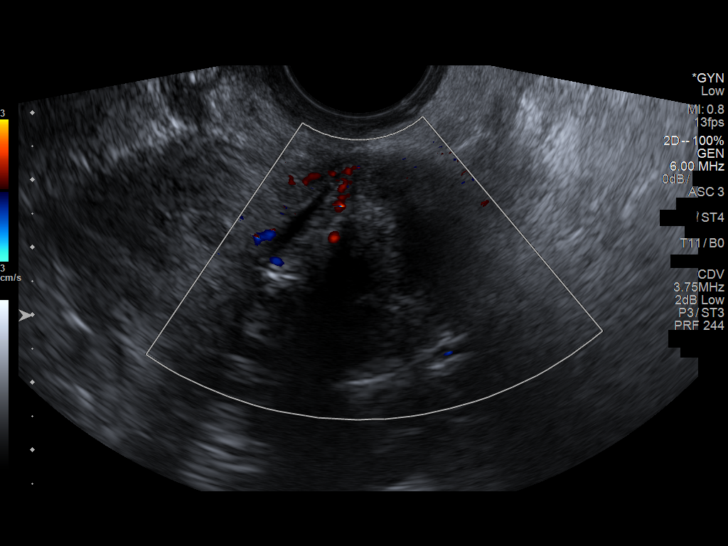
[im 59/59]
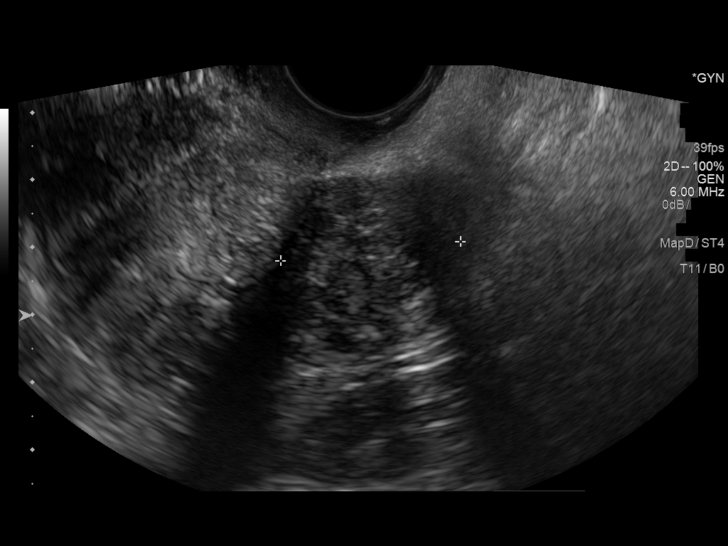

[13 of 25 positions shown; findings below may reference images not displayed]

FINDINGS: Uterus

Measurements: 6.6 x 3.9 x 6.1 cm. Uterus is mildly retroverted.
Heterogeneous structure along the anterior aspect of the uterus
measures 4.4 x 3.6 x 4.0 cm and likely represents a fibroid. There
is a heterogeneous structure along the posterior uterus that
measures 2.3 x 2.3 x 2.6 cm.

Endometrium

Thickness: 0.7 cm.  No focal abnormality visualized.

Right ovary

Measurements: Not visualized.

Left ovary

There is a solid heterogeneous structure in the expected region of
the left adnexa. This measures 3.8 x 3.2 x 2.7 cm. Structure is
mildly lobulated and does not appear to represent a normal ovary.
This could represent a pedunculated fibroid.

Other findings

No free fluid.
IMPRESSION: Evidence for uterine fibroids. There is a heterogeneous lobulated
structure in the region of left adnexa which is not compatible with
a normal ovary. This could represent a pedunculated fibroid but
indeterminate. Uterus and adnexal structures could be better
characterized with a pelvic MRI.

## 2022-12-09 ENCOUNTER — Ambulatory Visit: Payer: BLUE CROSS/BLUE SHIELD | Admitting: Family
